# Patient Record
Sex: Male | Born: 1975 | Race: White | Hispanic: No | Marital: Single | Smoking: Never smoker
Health system: Southern US, Community
[De-identification: ages and names within clinical notes are randomized; demographics above are authoritative.]

## PROBLEM LIST (undated history)

## (undated) DIAGNOSIS — Z87442 Personal history of urinary calculi: Secondary | ICD-10-CM

## (undated) DIAGNOSIS — N201 Calculus of ureter: Secondary | ICD-10-CM

---

## 2018-06-21 HISTORY — PX: CYSTOSCOPY/URETEROSCOPY/HOLMIUM LASER/STENT PLACEMENT: SHX6546

## 2020-08-21 ENCOUNTER — Emergency Department (HOSPITAL_COMMUNITY): Payer: BC Managed Care – PPO

## 2020-08-21 ENCOUNTER — Emergency Department (HOSPITAL_COMMUNITY)
Admission: EM | Admit: 2020-08-21 | Discharge: 2020-08-21 | Disposition: A | Discharge status: Home / Self Care | Payer: BC Managed Care – PPO | Attending: Emergency Medicine | Admitting: Emergency Medicine

## 2020-08-21 ENCOUNTER — Encounter (HOSPITAL_COMMUNITY): Payer: Self-pay

## 2020-08-21 DIAGNOSIS — R112 Nausea with vomiting, unspecified: Secondary | ICD-10-CM | POA: Insufficient documentation

## 2020-08-21 DIAGNOSIS — N200 Calculus of kidney: Secondary | ICD-10-CM

## 2020-08-21 DIAGNOSIS — N202 Calculus of kidney with calculus of ureter: Secondary | ICD-10-CM | POA: Diagnosis not present

## 2020-08-21 DIAGNOSIS — R310 Gross hematuria: Secondary | ICD-10-CM | POA: Insufficient documentation

## 2020-08-21 DIAGNOSIS — R109 Unspecified abdominal pain: Secondary | ICD-10-CM | POA: Diagnosis present

## 2020-08-21 LAB — CBC WITH DIFFERENTIAL/PLATELET
Abs Immature Granulocytes: 0.03 10*3/uL (ref 0.00–0.07)
Basophils Absolute: 0 10*3/uL (ref 0.0–0.1)
Basophils Relative: 1 %
Eosinophils Absolute: 0 10*3/uL (ref 0.0–0.5)
Eosinophils Relative: 0 %
HCT: 46.3 % (ref 39.0–52.0)
Hemoglobin: 15.7 g/dL (ref 13.0–17.0)
Immature Granulocytes: 0 %
Lymphocytes Relative: 12 %
Lymphs Abs: 0.9 10*3/uL (ref 0.7–4.0)
MCH: 32.3 pg (ref 26.0–34.0)
MCHC: 33.9 g/dL (ref 30.0–36.0)
MCV: 95.3 fL (ref 80.0–100.0)
Monocytes Absolute: 1.1 10*3/uL — ABNORMAL HIGH (ref 0.1–1.0)
Monocytes Relative: 13 %
Neutro Abs: 6 10*3/uL (ref 1.7–7.7)
Neutrophils Relative %: 74 %
Platelets: 216 10*3/uL (ref 150–400)
RBC: 4.86 MIL/uL (ref 4.22–5.81)
RDW: 12.5 % (ref 11.5–15.5)
WBC: 8.1 10*3/uL (ref 4.0–10.5)
nRBC: 0 % (ref 0.0–0.2)

## 2020-08-21 LAB — BASIC METABOLIC PANEL
Anion gap: 8 (ref 5–15)
BUN: 21 mg/dL — ABNORMAL HIGH (ref 6–20)
CO2: 25 mmol/L (ref 22–32)
Calcium: 9.1 mg/dL (ref 8.9–10.3)
Chloride: 105 mmol/L (ref 98–111)
Creatinine, Ser: 1.15 mg/dL (ref 0.61–1.24)
GFR, Estimated: >60 mL/min (ref >60)
Glucose, Bld: 118 mg/dL — ABNORMAL HIGH (ref 70–99)
Potassium: 4.7 mmol/L (ref 3.5–5.1)
Sodium: 138 mmol/L (ref 135–145)

## 2020-08-21 MED ORDER — KETOROLAC TROMETHAMINE 15 MG/ML IJ SOLN
15.0000 mg | Freq: Once | INTRAMUSCULAR | Status: AC
  Administered 2020-08-21: 15 mg via INTRAVENOUS
  Filled 2020-08-21: qty 1

## 2020-08-21 MED ORDER — ONDANSETRON HCL 4 MG/2ML IJ SOLN
4.0000 mg | Freq: Once | INTRAMUSCULAR | Status: AC
  Administered 2020-08-21: 4 mg via INTRAVENOUS
  Filled 2020-08-21: qty 2

## 2020-08-21 MED ORDER — FENTANYL CITRATE (PF) 100 MCG/2ML IJ SOLN
50.0000 ug | Freq: Once | INTRAMUSCULAR | Status: AC
  Administered 2020-08-21: 50 ug via INTRAVENOUS
  Filled 2020-08-21: qty 2

## 2020-08-21 MED ORDER — HYDROCODONE-ACETAMINOPHEN 5-325 MG PO TABS: 1.0000 | ORAL_TABLET | ORAL | 0 refills | Status: DC | PRN

## 2020-08-21 MED ORDER — SODIUM CHLORIDE 0.9 % IV BOLUS
500.0000 mL | Freq: Once | INTRAVENOUS | Status: AC
  Administered 2020-08-21: 500 mL via INTRAVENOUS

## 2020-08-21 MED ORDER — IBUPROFEN 600 MG PO TABS: 600.0000 mg | ORAL_TABLET | Freq: Four times a day (QID) | ORAL | 0 refills | Status: AC | PRN

## 2020-08-21 MED ORDER — ONDANSETRON 8 MG PO TBDP: 8.0000 mg | ORAL_TABLET | Freq: Three times a day (TID) | ORAL | 0 refills | Status: AC | PRN

## 2020-08-21 NOTE — ED Notes (Signed)
Patient to CT.

## 2020-08-21 NOTE — ED Notes (Signed)
Patient urinated in urinal- refuses foley catheter

## 2020-08-21 NOTE — Discharge Instructions (Addendum)
We saw you in the ER for the abdominal pain. °Our results indicate that you have a kidney stone. °We were able to get your pain is relative control, and we can safely send you home. ° °Take the meds prescribed. °Set up an appointment with the Urologist. °If the pain is unbearable, you start having fevers, chills, and are unable to keep any meds down - then return to the ER. °  °

## 2020-08-21 NOTE — ED Provider Notes (Signed)
[facility] Provider Note   CSN: [id number] Arrival date & time: 08/21/20  1023     History Chief Complaint  Patient presents with   Flank Pain    Carlos Marsh is a 45 y.o. male.  HPI     45 year old male comes in with chief complaint of flank pain. Patient has history of kidney stone that required surgical intervention.  He reports that his current pain started earlier this morning.  Pain is severe and right-sided.  He has associated nausea and vomiting.  Denies any blood in the urine but he is having some urinary urgency.  No pain over his testicles.   History reviewed. No pertinent past medical history.  There are no problems to display for this patient.   History reviewed. No pertinent surgical history.     History reviewed. No pertinent family history.  Social History   Tobacco Use   Smoking status: Never   Smokeless tobacco: Never    Home Medications Prior to Admission medications   Not on File    Allergies    Patient has no known allergies.  Review of Systems   Review of Systems  Constitutional:  Positive for activity change.  Gastrointestinal:  Positive for abdominal pain.  Genitourinary:  Positive for urgency. Negative for testicular pain.  Allergic/Immunologic: Negative for immunocompromised state.  Hematological:  Does not bruise/bleed easily.  All other systems reviewed and are negative.  Physical Exam Updated Vital Signs BP (!) 150/126   Pulse 76   Temp 97.9 F (36.6 C) (Oral)   Resp 18   SpO2 97%   Physical Exam Vitals and nursing note reviewed.  Constitutional:      General: He is in acute distress.     Appearance: He is well-developed.  HENT:     Head: Atraumatic.  Cardiovascular:     Rate and Rhythm: Normal rate.  Pulmonary:     Effort: Pulmonary effort is normal.  Musculoskeletal:     Cervical back: Neck supple.  Skin:    General: Skin is warm.  Neurological:     Mental  Status: He is alert and oriented to person, place, and time.    ED Results / Procedures / Treatments   Labs (all labs ordered are listed, but only abnormal results are displayed) Labs Reviewed  BASIC METABOLIC PANEL - Abnormal; Notable for the following components:      Result Value   Glucose, Bld 118 (*)    BUN 21 (*)    All other components within normal limits  CBC WITH DIFFERENTIAL/PLATELET - Abnormal; Notable for the following components:   Monocytes Absolute 1.1 (*)    All other components within normal limits    EKG None  Radiology US Renal  Result Date: 08/21/2020 CLINICAL DATA:  Evaluate for hydronephrosis Nephrolithiasis EXAM: RENAL / URINARY TRACT ULTRASOUND COMPLETE COMPARISON:  None. FINDINGS: Right Kidney: Renal measurements: 12.1 x 6.2 x 7.1 cm = volume: 282 mL. Mild increased echogenicity the renal cortex without significant thinning. No mass or hydronephrosis visualized. Left Kidney: Renal measurements: 11.4 x 6.3 x 6.2 cm = volume: 231 mL. Echogenicity within normal limits. No mass or hydronephrosis visualized. Bladder: Unable to evaluate due to collapsed configuration. Other: None. IMPRESSION: No acute abnormality of the kidneys.  No hydronephrosis. Electronically Signed   By: Acquanetta Belling M.D.   On: 08/21/2020 12:37   CT Renal Stone Study  Result Date: 08/21/2020 CLINICAL DATA:  Onset right flank pain and difficulty  urinating last night. EXAM: CT ABDOMEN AND PELVIS WITHOUT CONTRAST TECHNIQUE: Multidetector CT imaging of the abdomen and pelvis was performed following the standard protocol without IV contrast. COMPARISON:  Renal ultrasound today. FINDINGS: Lower chest: Mild dependent basilar atelectasis. No pleural or pericardial effusion. Hepatobiliary: No focal liver abnormality is seen. No gallstones, gallbladder wall thickening, or biliary dilatation. Pancreas: Unremarkable. No pancreatic ductal dilatation or surrounding inflammatory changes. Spleen: Normal in size  without focal abnormality. Adrenals/Urinary Tract: The adrenal glands appear normal. There is stranding about the right kidney and ureter. Minimal fullness of the right intrarenal collecting system is identified. The patient has a punctate stone in the right ureter just proximal to the ureterovesical junction. The left kidney and ureter appear normal. The urinary bladder is completely decompressed. No stones are seen in the bladder. Stomach/Bowel: Stomach is within normal limits. Appendix appears normal. No evidence of bowel wall thickening, distention, or inflammatory changes. Vascular/Lymphatic: No significant vascular findings are present. No enlarged abdominal or pelvic lymph nodes. Reproductive: Prostate is unremarkable. Other: None. Musculoskeletal: No acute or focal abnormality. Minimal disc bulge L5-S1 noted. IMPRESSION: Mild fullness of the right intrarenal collecting system with some stranding about the right kidney and ureter due to a punctate stone just proximal to the ureterovesical junction. The study is otherwise negative. Electronically Signed   By: Drusilla Kanner M.D.   On: 08/21/2020 14:27    Procedures Procedures   Medications Ordered in ED Medications  ketorolac (TORADOL) 15 MG/ML injection 15 mg (15 mg Intravenous Given 08/21/20 1148)  fentaNYL (SUBLIMAZE) injection 50 mcg (50 mcg Intravenous Given 08/21/20 1149)  ondansetron (ZOFRAN) injection 4 mg (4 mg Intravenous Given 08/21/20 1158)    ED Course  I have reviewed the triage vital signs and the nursing notes.  Pertinent labs & imaging results that were available during my care of the patient were reviewed by me and considered in my medical decision making (see chart for details).  Clinical Course as of 08/21/20 1728  Fri Aug 21, 2020  1502 Ultrasound was equivocal.  CT completed therefore, it shows a punctate stone.  Patient is comfortable at this time but feels dehydrated.  IV fluid and repeat Toradol ordered.  Stable for  discharge after. [AN]    Clinical Course User Index [AN] Derwood Kaplan, MD   MDM Rules/Calculators/A&P                          45 year old male comes in with chief complaint of flank pain.  He has known history of kidney stone and appears to be in acute distress because of it.  Other possibilities include perforated bowel, torsion of the testicles, diverticulitis, appendicitis, cystitis.  Given the sudden nature of this pain and known history of stone, it is still high on our differential diagnosis.  Ultrasound kidney ordered.  I reviewed the imaging from outside institution which confirmed a 3 mm stone last time he had this problem.  3:07 PM Patient's pain did improve after Toradol.  Ultrasound unfortunately did not reveal any hydronephrosis.  I discussed my clinical suspicion that he has kidney stone despite negative ultrasound.  I approached him to see if he wants to focus on symptom management and returning to the ER if his symptoms get worse with the presumed diagnosis of stone.  Patient wants to get a CT scan to be absolutely sure -therefore CT renal stone has been ordered.  Final Clinical Impression(s) / ED Diagnoses Final diagnoses:  Gross hematuria    Rx / DC Orders ED Discharge Orders     None        Derwood Kaplan, MD 08/21/20 1728

## 2020-08-21 NOTE — ED Triage Notes (Signed)
Pt arrived via walk in, c/o right sided flank pain. Unable to urinate since last night. Hx of kidney stones, states this feels very similar.

## 2020-08-26 ENCOUNTER — Encounter (HOSPITAL_BASED_OUTPATIENT_CLINIC_OR_DEPARTMENT_OTHER): Payer: Self-pay | Admitting: Urology

## 2020-08-26 ENCOUNTER — Other Ambulatory Visit: Payer: Self-pay

## 2020-08-26 ENCOUNTER — Other Ambulatory Visit: Payer: Self-pay | Admitting: Urology

## 2020-08-26 NOTE — Progress Notes (Signed)
Spoke w/ via phone for pre-op interview--- Pt Lab needs dos---- no              Lab results------pt has current lab work ,results in epic, CBCdiff/ BMP. COVID test -----patient states asymptomatic no test needed Arrive at ------- 0530 on 08-28-2020 NPO after MN NO Solid Food.  Clear liquids from MN until--- 0430 Med rec completed Medications to take morning of surgery -----  if needed may take norco/ zofran Diabetic medication ----- n/a Patient instructed no nail polish to be worn day of surgery Patient instructed to bring photo id and insurance card day of surgery Patient aware to have Driver (ride ) / caregiver or 24 hours after surgery --mother, Thurston Hole Patient Special Instructions ----- n/a Pre-Op special Istructions ----- pre-orders pending second sign , case just added on today Patient verbalized understanding of instructions that were given at this phone interview. Patient denies shortness of breath, chest pain, fever, cough at this phone interview.

## 2020-08-28 ENCOUNTER — Ambulatory Visit (HOSPITAL_BASED_OUTPATIENT_CLINIC_OR_DEPARTMENT_OTHER)
Admission: RE | Admit: 2020-08-28 | Discharge: 2020-08-28 | Disposition: A | Discharge status: Home / Self Care | Payer: BC Managed Care – PPO | Attending: Urology | Admitting: Urology

## 2020-08-28 ENCOUNTER — Ambulatory Visit (HOSPITAL_BASED_OUTPATIENT_CLINIC_OR_DEPARTMENT_OTHER): Payer: BC Managed Care – PPO | Admitting: Certified Registered Nurse Anesthetist

## 2020-08-28 ENCOUNTER — Encounter (HOSPITAL_BASED_OUTPATIENT_CLINIC_OR_DEPARTMENT_OTHER)
Admission: RE | Disposition: A | Discharge status: Home / Self Care | Payer: Self-pay | Source: Home / Self Care | Attending: Urology

## 2020-08-28 ENCOUNTER — Encounter (HOSPITAL_BASED_OUTPATIENT_CLINIC_OR_DEPARTMENT_OTHER): Payer: Self-pay | Admitting: Urology

## 2020-08-28 DIAGNOSIS — N132 Hydronephrosis with renal and ureteral calculous obstruction: Secondary | ICD-10-CM | POA: Diagnosis present

## 2020-08-28 HISTORY — DX: Personal history of urinary calculi: Z87.442

## 2020-08-28 HISTORY — PX: CYSTOSCOPY WITH RETROGRADE PYELOGRAM, URETEROSCOPY AND STENT PLACEMENT: SHX5789

## 2020-08-28 HISTORY — DX: Calculus of ureter: N20.1

## 2020-08-28 SURGERY — CYSTOURETEROSCOPY, WITH RETROGRADE PYELOGRAM AND STENT INSERTION
Anesthesia: General | Site: Bladder | Laterality: Right

## 2020-08-28 MED ORDER — FENTANYL CITRATE (PF) 100 MCG/2ML IJ SOLN: 25.0000 ug | INTRAMUSCULAR | Status: DC | PRN

## 2020-08-28 MED ORDER — LACTATED RINGERS IV SOLN: INTRAVENOUS | Status: DC

## 2020-08-28 MED ORDER — DEXAMETHASONE SODIUM PHOSPHATE 10 MG/ML IJ SOLN
INTRAMUSCULAR | Status: DC | PRN
  Administered 2020-08-28: 10 mg via INTRAVENOUS

## 2020-08-28 MED ORDER — ACETAMINOPHEN 500 MG PO TABS
ORAL_TABLET | ORAL | Status: AC
  Filled 2020-08-28: qty 2

## 2020-08-28 MED ORDER — MIDAZOLAM HCL 2 MG/2ML IJ SOLN
INTRAMUSCULAR | Status: AC
  Filled 2020-08-28: qty 2

## 2020-08-28 MED ORDER — PROPOFOL 10 MG/ML IV BOLUS
INTRAVENOUS | Status: AC
  Filled 2020-08-28: qty 20

## 2020-08-28 MED ORDER — DEXAMETHASONE SODIUM PHOSPHATE 10 MG/ML IJ SOLN
INTRAMUSCULAR | Status: AC
  Filled 2020-08-28: qty 1

## 2020-08-28 MED ORDER — ACETAMINOPHEN 500 MG PO TABS
1000.0000 mg | ORAL_TABLET | Freq: Once | ORAL | Status: AC
  Administered 2020-08-28: 1000 mg via ORAL

## 2020-08-28 MED ORDER — FENTANYL CITRATE (PF) 100 MCG/2ML IJ SOLN
INTRAMUSCULAR | Status: AC
  Filled 2020-08-28: qty 2

## 2020-08-28 MED ORDER — ONDANSETRON HCL 4 MG/2ML IJ SOLN
INTRAMUSCULAR | Status: AC
  Filled 2020-08-28: qty 2

## 2020-08-28 MED ORDER — LIDOCAINE HCL (PF) 2 % IJ SOLN
INTRAMUSCULAR | Status: AC
  Filled 2020-08-28: qty 5

## 2020-08-28 MED ORDER — OXYCODONE-ACETAMINOPHEN 5-325 MG PO TABS: 1.0000 | ORAL_TABLET | Freq: Four times a day (QID) | ORAL | 0 refills | Status: AC | PRN

## 2020-08-28 MED ORDER — SODIUM CHLORIDE 0.9 % IR SOLN
Status: DC | PRN
  Administered 2020-08-28: 3000 mL via INTRAVESICAL

## 2020-08-28 MED ORDER — MIDAZOLAM HCL 5 MG/5ML IJ SOLN
INTRAMUSCULAR | Status: DC | PRN
  Administered 2020-08-28: 2 mg via INTRAVENOUS

## 2020-08-28 MED ORDER — IOHEXOL 300 MG/ML  SOLN
INTRAMUSCULAR | Status: DC | PRN
  Administered 2020-08-28: 5 mL via URETHRAL

## 2020-08-28 MED ORDER — LIDOCAINE 2% (20 MG/ML) 5 ML SYRINGE
INTRAMUSCULAR | Status: DC | PRN
  Administered 2020-08-28: 80 mg via INTRAVENOUS

## 2020-08-28 MED ORDER — FENTANYL CITRATE (PF) 100 MCG/2ML IJ SOLN
INTRAMUSCULAR | Status: DC | PRN
  Administered 2020-08-28: 100 ug via INTRAVENOUS

## 2020-08-28 MED ORDER — CEPHALEXIN 500 MG PO CAPS: 500.0000 mg | ORAL_CAPSULE | Freq: Two times a day (BID) | ORAL | 0 refills | Status: AC

## 2020-08-28 MED ORDER — SENNOSIDES-DOCUSATE SODIUM 8.6-50 MG PO TABS: 1.0000 | ORAL_TABLET | Freq: Two times a day (BID) | ORAL | 0 refills | Status: AC

## 2020-08-28 MED ORDER — KETOROLAC TROMETHAMINE 30 MG/ML IJ SOLN
INTRAMUSCULAR | Status: DC | PRN
  Administered 2020-08-28: 30 mg via INTRAVENOUS

## 2020-08-28 MED ORDER — GENTAMICIN SULFATE 40 MG/ML IJ SOLN
5.0000 mg/kg | INTRAVENOUS | Status: AC
  Administered 2020-08-28: 500 mg via INTRAVENOUS
  Filled 2020-08-28: qty 12.5

## 2020-08-28 MED ORDER — PROPOFOL 10 MG/ML IV BOLUS
INTRAVENOUS | Status: DC | PRN
  Administered 2020-08-28: 200 mg via INTRAVENOUS

## 2020-08-28 MED ORDER — KETOROLAC TROMETHAMINE 30 MG/ML IJ SOLN
INTRAMUSCULAR | Status: AC
  Filled 2020-08-28: qty 1

## 2020-08-28 MED ORDER — ONDANSETRON HCL 4 MG/2ML IJ SOLN
INTRAMUSCULAR | Status: DC | PRN
  Administered 2020-08-28: 4 mg via INTRAVENOUS

## 2020-08-28 SURGICAL SUPPLY — 26 items
BAG DRAIN URO-CYSTO SKYTR STRL (DRAIN) ×3 IMPLANT
BAG DRN UROCATH (DRAIN) ×2
BASKET LASER NITINOL 1.9FR (BASKET) IMPLANT
BSKT STON RTRVL 120 1.9FR (BASKET)
CATH INTERMIT  6FR 70CM (CATHETERS) IMPLANT
CLOTH BEACON ORANGE TIMEOUT ST (SAFETY) ×3 IMPLANT
DRSG TEGADERM 2-3/8X2-3/4 SM (GAUZE/BANDAGES/DRESSINGS) ×3 IMPLANT
FIBER LASER FLEXIVA 365 (UROLOGICAL SUPPLIES) IMPLANT
GLOVE SURG ENC MOIS LTX SZ7.5 (GLOVE) ×3 IMPLANT
GOWN STRL REUS W/TWL LRG LVL3 (GOWN DISPOSABLE) ×3 IMPLANT
GUIDEWIRE ANG ZIPWIRE 038X150 (WIRE) ×3 IMPLANT
GUIDEWIRE STR DUAL SENSOR (WIRE) ×3 IMPLANT
IV NS 1000ML (IV SOLUTION) ×3
IV NS 1000ML BAXH (IV SOLUTION) ×2 IMPLANT
IV NS IRRIG 3000ML ARTHROMATIC (IV SOLUTION) ×3 IMPLANT
KIT TURNOVER CYSTO (KITS) ×3 IMPLANT
MANIFOLD NEPTUNE II (INSTRUMENTS) ×3 IMPLANT
NS IRRIG 500ML POUR BTL (IV SOLUTION) ×3 IMPLANT
PACK CYSTO (CUSTOM PROCEDURE TRAY) ×3 IMPLANT
STENT POLARIS 5FRX26 (STENTS) ×3 IMPLANT
SYR 10ML LL (SYRINGE) ×3 IMPLANT
TRACTIP FLEXIVA PULS ID 200XHI (Laser) IMPLANT
TRACTIP FLEXIVA PULSE ID 200 (Laser)
TUBE CONNECTING 12X1/4 (SUCTIONS) ×3 IMPLANT
TUBE FEEDING 8FR 16IN STR KANG (MISCELLANEOUS) IMPLANT
TUBING UROLOGY SET (TUBING) ×3 IMPLANT

## 2020-08-28 NOTE — H&P (Signed)
Carlos Marsh is an 45 y.o. male.    Chief Complaint: Pre-OP RIGHT Ureteroscopic Stone Manipulation  HPI:   1 - Recurrent Urolithiasis -  2020 - U[place] another city  08/2020 - 104mm Rt distal stone (just above intramural ureter) by ER CT 08/21/20 on eval flank pain and irritative voiding. Cr 1.1. Given trial of medical therapy with alpha blocker and pain meds.    PMH unremakable. NO CV disase / blood thinners. He works as Merchandiser, retail at [employer]. No PCP at present.   Today "[place]" is seen to proceed with RIGHT ureteroscopy for small distal stone with refractory colic. No interval fevers.   Past Medical History:  Diagnosis Date   History of kidney stones    Right ureteral stone     Past Surgical History:  Procedure Laterality Date   CYSTOSCOPY/URETEROSCOPY/HOLMIUM LASER/STENT PLACEMENT Left 06/21/2018   @ [facility]    History reviewed. No pertinent family history. Social History:  reports that he has never smoked. He has never used smokeless tobacco. He reports current alcohol use. He reports that he does not use drugs.  Allergies: No Known Allergies  Medications Prior to Admission  Medication Sig Dispense Refill   HYDROcodone-acetaminophen (NORCO/VICODIN) 5-325 MG tablet Take 1 tablet by mouth every 4 (four) hours as needed. 6 tablet 0   ibuprofen (ADVIL) 600 MG tablet Take 1 tablet (600 mg total) by mouth every 6 (six) hours as needed. 30 tablet 0   ondansetron (ZOFRAN ODT) 8 MG disintegrating tablet Take 1 tablet (8 mg total) by mouth every 8 (eight) hours as needed for nausea. 20 tablet 0    No results found for this or any previous visit (from the past 48 hour(s)). No results found.  Review of Systems  Constitutional: Negative.  Negative for chills and fever.  Genitourinary:  Positive for flank pain and urgency.   Blood pressure (!) 142/92, pulse 60, temperature 98.1 F (36.7 C), temperature source Oral, resp. rate 16, height 6' (1.829 m),  weight 99.9 kg, SpO2 97 %. Physical Exam Vitals reviewed.  HENT:     Head: Normocephalic.     Nose: Nose normal.     Mouth/Throat:     Mouth: Mucous membranes are moist.  Eyes:     Pupils: Pupils are equal, round, and reactive to light.  Cardiovascular:     Rate and Rhythm: Normal rate.     Pulses: Normal pulses.  Pulmonary:     Effort: Pulmonary effort is normal.  Abdominal:     General: Abdomen is flat.  Genitourinary:    Comments: Mild Rt  CVAT at present Musculoskeletal:        General: Normal range of motion.     Cervical back: Normal range of motion.  Skin:    General: Skin is warm.  Neurological:     General: No focal deficit present.     Mental Status: He is alert.  Psychiatric:        Mood and Affect: Mood normal.     Assessment/Plan  Proceed as planned with RIGHT ureteroscopic stone manipulation with goal of stone free. Risks, benefits, alternatives, expected peri-op course discussed previously and reiterated today.   Sebastian Ache, MD 08/28/2020, 7:05 AM

## 2020-08-28 NOTE — Anesthesia Procedure Notes (Signed)
Procedure Name: LMA Insertion Date/Time: 08/28/2020 7:25 AM Performed by: Sharyon Peitz D, CRNA Pre-anesthesia Checklist: Patient identified, Emergency Drugs available, Suction available and Patient being monitored Patient Re-evaluated:Patient Re-evaluated prior to induction Oxygen Delivery Method: Circle system utilized Preoxygenation: Pre-oxygenation with 100% oxygen Induction Type: IV induction Ventilation: Mask ventilation without difficulty LMA: LMA inserted LMA Size: 5.0 Tube type: Oral Number of attempts: 1 Placement Confirmation: positive ETCO2 and breath sounds checked- equal and bilateral Tube secured with: Tape Dental Injury: Teeth and Oropharynx as per pre-operative assessment

## 2020-08-28 NOTE — Brief Op Note (Signed)
08/28/2020  8:00 AM  PATIENT:  Carlos Marsh  45 y.o. male  PRE-OPERATIVE DIAGNOSIS:  RIGHT URETERAL STONE  POST-OPERATIVE DIAGNOSIS:  RIGHT URETERAL STONE  PROCEDURE:  Procedure(s) with comments: CYSTOSCOPY WITH RETROGRADE PYELOGRAM, URETEROSCOPY AND STENT PLACEMENT (Right) - 1 HR  SURGEON:  Surgeon(s) and Role:    * Sebastian Ache, MD - Primary  PHYSICIAN ASSISTANT:   ASSISTANTS: none   ANESTHESIA:   general  EBL:  minimal   BLOOD ADMINISTERED:none  DRAINS: none   LOCAL MEDICATIONS USED:  NONE  SPECIMEN:  Source of Specimen:  Rt ureteral stone  DISPOSITION OF SPECIMEN:   [facility] for compositional analysis  COUNTS:  YES  TOURNIQUET:  * No tourniquets in log *  DICTATION: .Other Dictation: Dictation Number [id number]  PLAN OF CARE: Discharge to home after PACU  PATIENT DISPOSITION:  PACU - hemodynamically stable.   Delay start of Pharmacological VTE agent (>24hrs) due to surgical blood loss or risk of bleeding: not applicable

## 2020-08-28 NOTE — Transfer of Care (Signed)
Immediate Anesthesia Transfer of Care Note  Patient: Carlos Marsh  Procedure(s) Performed: CYSTOSCOPY WITH RETROGRADE PYELOGRAM, URETEROSCOPY , STONE BASKETTING, AND STENT PLACEMENT (Right: Bladder)  Patient Location: PACU  Anesthesia Type:General  Level of Consciousness: awake, alert  and oriented  Airway & Oxygen Therapy: Patient Spontanous Breathing and Patient connected to nasal cannula oxygen  Post-op Assessment: Report given to RN and Post -op Vital signs reviewed and stable  Post vital signs: Reviewed and stable  Last Vitals:  Vitals Value Taken Time  BP 135/98 08/28/20 0815  Temp    Pulse 59 08/28/20 0817  Resp 16 08/28/20 0817  SpO2 95 % 08/28/20 0817  Vitals shown include unvalidated device data.  Last Pain:  Vitals:   08/28/20 0558  TempSrc: Oral  PainSc: 0-No pain      Patients Stated Pain Goal: 3 (08/28/20 0558)  Complications: No notable events documented.

## 2020-08-28 NOTE — Anesthesia Postprocedure Evaluation (Signed)
Anesthesia Post Note  Patient: Carlos Marsh  Procedure(s) Performed: CYSTOSCOPY WITH RETROGRADE PYELOGRAM, URETEROSCOPY , STONE BASKETTING, AND STENT PLACEMENT (Right: Bladder)     Patient location during evaluation: PACU Anesthesia Type: General Level of consciousness: awake and alert Pain management: pain level controlled Vital Signs Assessment: post-procedure vital signs reviewed and stable Respiratory status: spontaneous breathing, nonlabored ventilation, respiratory function stable and patient connected to nasal cannula oxygen Cardiovascular status: blood pressure returned to baseline and stable Postop Assessment: no apparent nausea or vomiting Anesthetic complications: no   No notable events documented.  Last Vitals:  Vitals:   08/28/20 0830 08/28/20 0845  BP: 126/85 114/88  Pulse: 64 (!) 56  Resp: 17 13  Temp:    SpO2: 100% 95%    Last Pain:  Vitals:   08/28/20 0819  TempSrc: Oral  PainSc:                  Dezyrae Kensinger L Huyen Perazzo

## 2020-08-28 NOTE — Anesthesia Preprocedure Evaluation (Addendum)
Anesthesia Evaluation  Patient identified by MRN, date of birth, ID band Patient awake    Reviewed: Allergy & Precautions, NPO status , Patient's Chart, lab work & pertinent test results  Airway Mallampati: III  TM Distance: >3 FB Neck ROM: Full    Dental no notable dental hx. (+) Teeth Intact, Dental Advisory Given   Pulmonary neg pulmonary ROS,    Pulmonary exam normal breath sounds clear to auscultation       Cardiovascular negative cardio ROS Normal cardiovascular exam Rhythm:Regular Rate:Normal     Neuro/Psych negative neurological ROS  negative psych ROS   GI/Hepatic negative GI ROS, Neg liver ROS,   Endo/Other  negative endocrine ROS  Renal/GU negative Renal ROS  negative genitourinary   Musculoskeletal negative musculoskeletal ROS (+)   Abdominal   Peds  Hematology negative hematology ROS (+)   Anesthesia Other Findings Right ureteral stone  Reproductive/Obstetrics                            Anesthesia Physical Anesthesia Plan  ASA: 1  Anesthesia Plan: General   Post-op Pain Management:    Induction: Intravenous  PONV Risk Score and Plan: 2 and Ondansetron, Dexamethasone and Midazolam  Airway Management Planned: LMA  Additional Equipment:   Intra-op Plan:   Post-operative Plan: Extubation in OR  Informed Consent: I have reviewed the patients History and Physical, chart, labs and discussed the procedure including the risks, benefits and alternatives for the proposed anesthesia with the patient or authorized representative who has indicated his/her understanding and acceptance.     Dental advisory given  Plan Discussed with: CRNA  Anesthesia Plan Comments:         Anesthesia Quick Evaluation

## 2020-08-28 NOTE — Discharge Instructions (Addendum)
1 - You may have urinary urgency (bladder spasms) and bloody urine on / off with stent in place. This is normal.  2 - Remove tethered stent on Monday mornging at home by pulling on string, then blue-white plastic tubing, and discarding. Office is open Monday if any problems arise.   3 - Call MD or go to ER for fever >102, severe pain / nausea / vomiting not relieved by medications, or acute change in medical status  Post Anesthesia Home Care Instructions  Activity: Get plenty of rest for the remainder of the day. A responsible adult should stay with you for 24 hours following the procedure.  For the next 24 hours, DO NOT: -Drive a car -Advertising copywriter -Drink alcoholic beverages -Take any medication unless instructed by your physician -Make any legal decisions or sign important papers.  Meals: Start with liquid foods such as gelatin or soup. Progress to regular foods as tolerated. Avoid greasy, spicy, heavy foods. If nausea and/or vomiting occur, drink only clear liquids until the nausea and/or vomiting subsides. Call your physician if vomiting continues.  Special Instructions/Symptoms: Your throat may feel dry or sore from the anesthesia or the breathing tube placed in your throat during surgery. If this causes discomfort, gargle with warm salt water. The discomfort should disappear within 24 hours.        [facility] [phone] Post Ureteroscopy With or Without Stent Instructions  Definitions:  Ureter: The duct that transports urine from the kidney to the bladder. Stent:   A plastic hollow tube that is placed into the ureter, from the kidney to the bladder to prevent the ureter from swelling shut.  GENERAL INSTRUCTIONS:  Despite the fact that no skin incisions were used, the area around the ureter and bladder is raw and irritated. The stent is a foreign body which will further irritate the bladder wall. This irritation is manifested by increased frequency  of urination, both day and night, and by an increase in the urge to urinate. In some, the urge to urinate is present almost always. Sometimes the urge is strong enough that you may not be able to stop yourself from urinating. The only real cure is to remove the stent and then give time for the bladder wall to heal which can't be done until the danger of the ureter swelling shut has passed, which varies.  You may see some blood in your urine while the stent is in place and a few days afterwards. Do not be alarmed, even if the urine was clear for a while. Get off your feet and drink lots of fluids until clearing occurs. If you start to pass clots or don't improve, call us.  DIET: You may return to your normal diet immediately. Because of the raw surface of your bladder, alcohol, spicy foods, acid type foods and drinks with caffeine may cause irritation or frequency and should be used in moderation. To keep your urine flowing freely and to avoid constipation, drink plenty of fluids during the day ( 8-10 glasses ). Tip: Avoid cranberry juice because it is very acidic.  ACTIVITY: Your physical activity doesn't need to be restricted. However, if you are very active, you may see some blood in your urine. We suggest that you reduce your activity under these circumstances until the bleeding has stopped.  BOWELS: It is important to keep your bowels regular during the postoperative period. Straining with bowel movements can cause bleeding. A bowel movement every other day is reasonable. Use  a mild laxative if needed, such as Milk of Magnesia 2-3 tablespoons, or 2 Dulcolax tablets. Call if you continue to have problems. If you have been taking narcotics for pain, before, during or after your surgery, you may be constipated. Take a laxative if necessary.   MEDICATION: You should resume your pre-surgery medications unless told not to. In addition you will often be given an antibiotic to prevent infection. These  should be taken as prescribed until the bottles are finished unless you are having an unusual reaction to one of the drugs.  PROBLEMS YOU SHOULD REPORT TO [place]: Fevers over 100.5 Fahrenheit. Heavy bleeding, or clots ( See above notes about blood in urine ). Inability to urinate. Drug reactions ( hives, rash, nausea, vomiting, diarrhea ). Severe burning or pain with urination that is not improving.  FOLLOW-UP: You will need a follow-up appointment to monitor your progress. Call for this appointment at the number listed above. Usually the first appointment will be about three to fourteen days after your surgery.

## 2020-08-31 ENCOUNTER — Encounter (HOSPITAL_BASED_OUTPATIENT_CLINIC_OR_DEPARTMENT_OTHER): Payer: Self-pay | Admitting: Urology

## 2020-09-01 NOTE — Op Note (Signed)
NAMEDEAKIN, LACEK MEDICAL RECORD NO: [id number] ACCOUNT NO: [id number] DATE OF BIRTH: [birth year] FACILITY: [facility] LOCATION: [facility] PHYSICIAN: Sebastian Ache, MD  Operative Report   DATE OF PROCEDURE: 08/28/2020  PREOPERATIVE DIAGNOSIS:  Right distal ureteral stone with refractory colic.  PROCEDURES PERFORMED: 1.  Cystoscopy, right retrograde pyelogram interpretation. 2.  Right ureteroscopy, basketing of stone. 3.  Insertion of right ureteral stent, 5 x 26 Polaris with tether.  ESTIMATED BLOOD LOSS:  Nil.  COMPLICATIONS:  None.  SPECIMEN:  Right ureteral stone for composition analysis.  FINDINGS:  1.  Minimal right hydronephrosis. 2.  Small, mostly proteinaceous appearing stone within the proximal right ureter, retrograde position from distal. 3.  Moderate mucosal edema in the distal ureter and within renal pelvis. 4.  Successful placement of right ureteral stent, proximal end in renal pelvis, distal end in urinary bladder.  INDICATIONS:  The patient is a pleasant 45 year old man with history of recurrent urolithiasis.  He was found on workup of colicky flank pain have a right distal ureteral stone.  There was minimal hydronephrosis.  This was relatively small, only at  around 3 mm or so.  He appropriately went on a trial of medical therapy with alpha blockers and pain medicines; however, he failed to pass his stone and his colic was quite difficult to control.  Options were discussed for further management including  continued medical therapy versus shockwave lithotripsy versus ureteroscopy.  He wished to proceed with the latter with a goal of stone free.  Informed consent was obtained and placed in the medical record.  DESCRIPTION OF PROCEDURE:  The patient being verified, procedure being right ureteroscopic stone manipulation was confirmed.  Procedure timeout was performed.  Intravenous antibiotics administered.  General LMA anesthesia induced.  The patient was placed   into a low lithotomy position.  Sterile field was created, prepped and draped the patient's penis, perineum and proximal thighs using iodine.  Cystourethroscopy was performed using 21-French rigid cystoscope with offset lens.  Inspection of anterior and  posterior urethra was unremarkable.  Inspection of bladder revealed no diverticula, calcifications or papillary lesions.  The right ureteral orifice was cannulated with a 6-French renal catheter and right retrograde pyelogram was obtained.  Right retrograde pyelogram demonstrated a single right ureter, single system right kidney.  There was no hydronephrosis, whatsoever.  There was a questionable mobile filling defect within the distal ureter.  A 0.038 ZIPwire was advanced to the level of  upper pole, set aside as a safety wire.  An 8-French feeding tube placed in the urinary bladder for pressure release.  A semirigid ureteroscopy was performed of the distal right ureter alongside a separate sensor working wire.  In the area of the distal  ureter, just above the intramural ureter, where we expected to find the stone, no calcification was noted.  There was some mild mucosal edema in this area.  This was concerning for possible retrograde positioning of the stone and the remaining distal  four-fifths of right ureter was inspected with the semirigid scope.  No calcifications were noted.  It was highly suspicious of retrograde positioning. The semirigid scope was exchanged over a sensor working wire for a flexible digital ureteroscope using  fluoroscopic guidance to the level of the kidney and very careful endoscopic exam was performed using a flexible digital scope including all calices x3.  There was a small, mostly proteinaceous appearing calcification in the upper pole calix.  This is  felt to most likely represent retrograde positioning of  prior stone.  This was amenable to simple basketing.  It was grasped with the escape basket and carefully navigated out  in its entirety.  There was no additional calcifications or mucosal  abnormalities noted of the right ureter.  Given the patient's relatively young age and submucosal edema of the distal ureter, it was felt that brief interval stenting with tether stent would be most advantageous and a new 5 x 26 Polaris type stent was  placed over the remaining safety wire using fluoroscopic guidance.  Good proximal and distal plane were noted.  Tether was left in place and fashioned to the dorsum of penis.  The procedure was terminated.  The patient tolerated the procedure well.  No  immediate perioperative complications.  The patient was taken to postanesthesia care in stable condition.  Plan for discharge home.   SHW D: 08/28/2020 8:05:37 am T: 08/28/2020 8:37:00 am  JOB: [id number]/ [id number]

## 2022-08-03 IMAGING — CT CT RENAL STONE PROTOCOL
2 of 4 series · 16 of 46 positions shown, 18 images · non-contrast
Comparison: Renal ultrasound today.

CLINICAL DATA: Onset right flank pain and difficulty urinating last
night.

EXAM:
CT ABDOMEN AND PELVIS WITHOUT CONTRAST
TECHNIQUE: Multidetector CT imaging of the abdomen and pelvis was performed
following the standard protocol without IV contrast.

[Series 2: axial st · axial · 0.97mm/px · z∈[+858,+1353]mm · 13 of 111 slices shown, 15 images]
[im 6/111  soft-tissue]
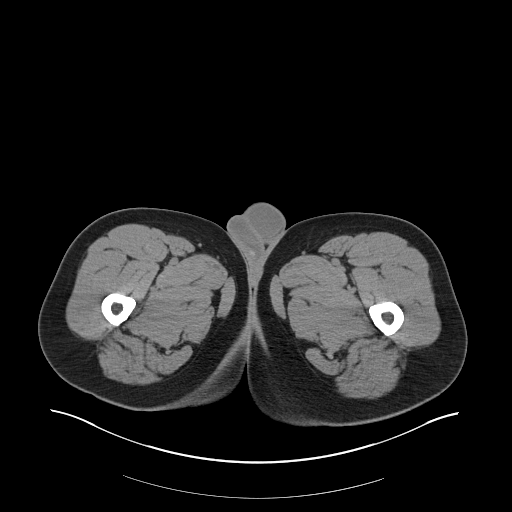
[im 6/111  bone]
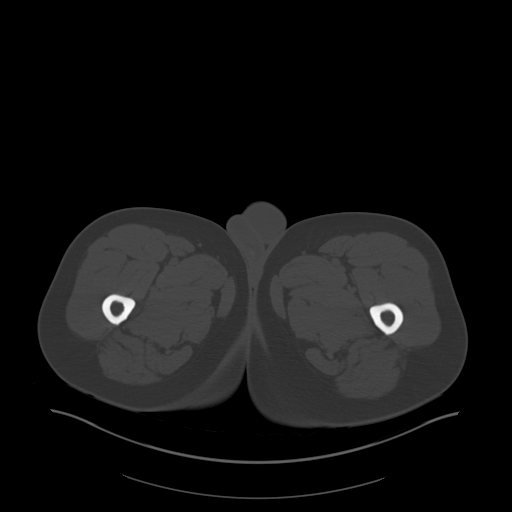
[im 18/111  soft-tissue]
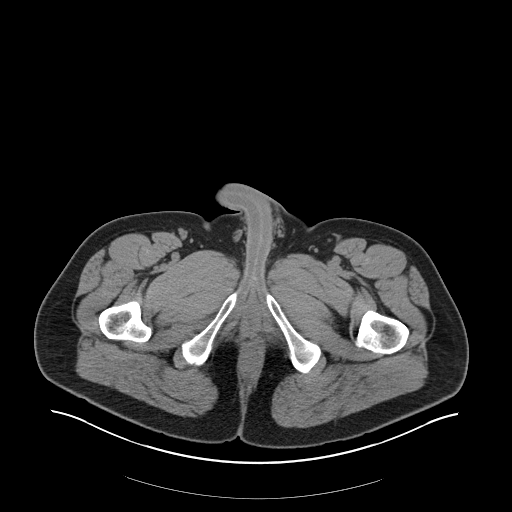
[im 24/111  soft-tissue]
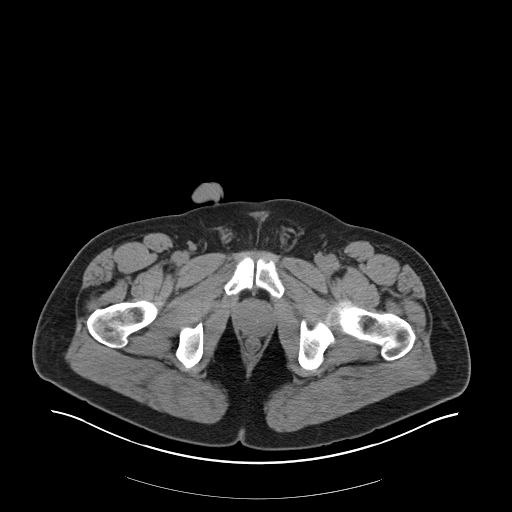
[im 29/111  soft-tissue]
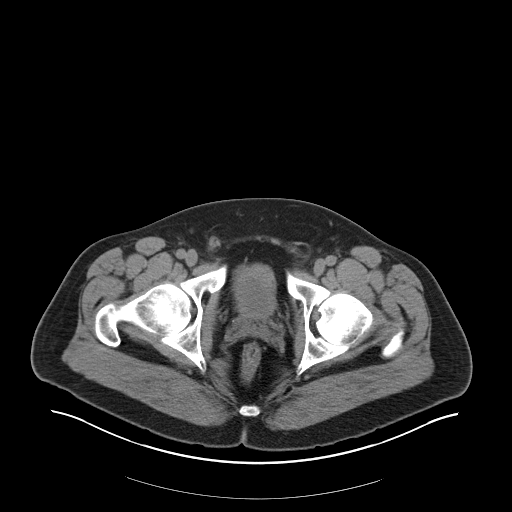
[im 41/111  soft-tissue]
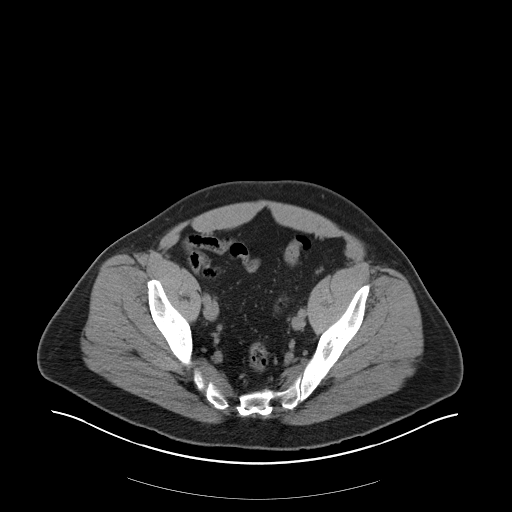
[im 47/111  soft-tissue]
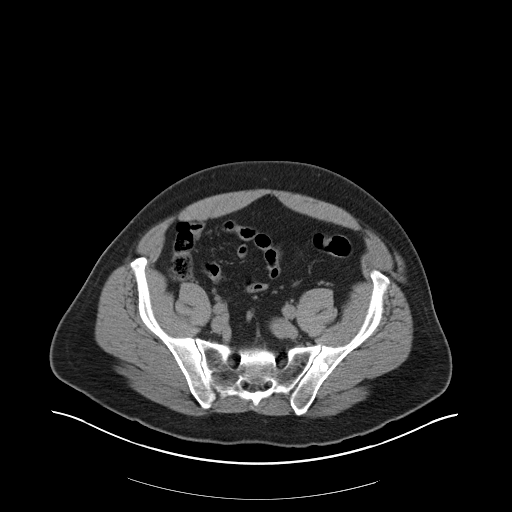
[im 58/111  soft-tissue]
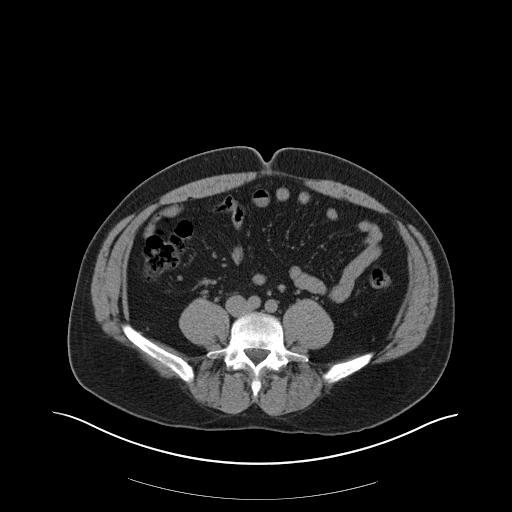
[im 64/111  soft-tissue]
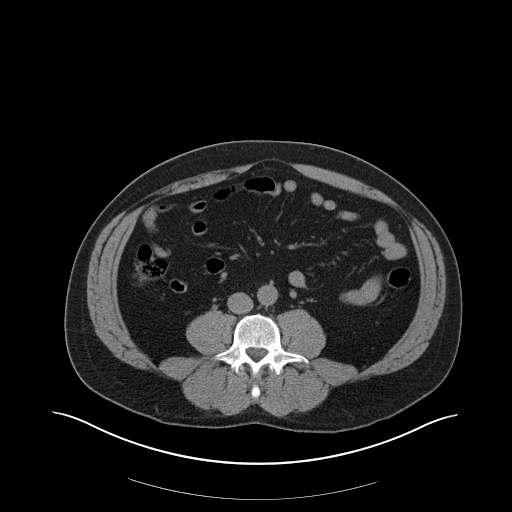
[im 70/111  soft-tissue]
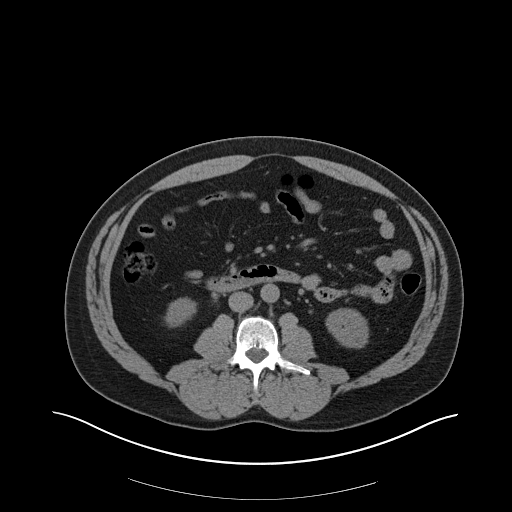
[im 70/111  bone]
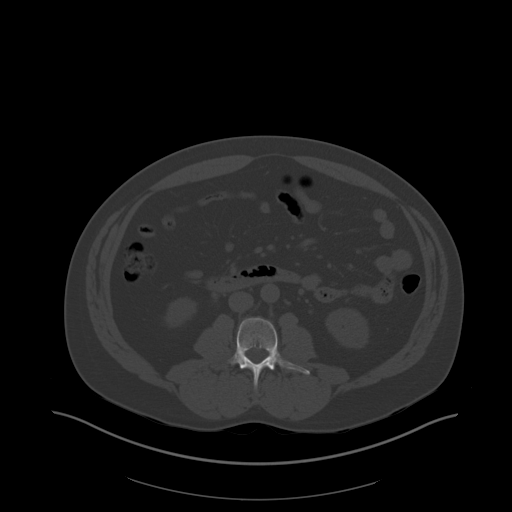
[im 82/111  soft-tissue]
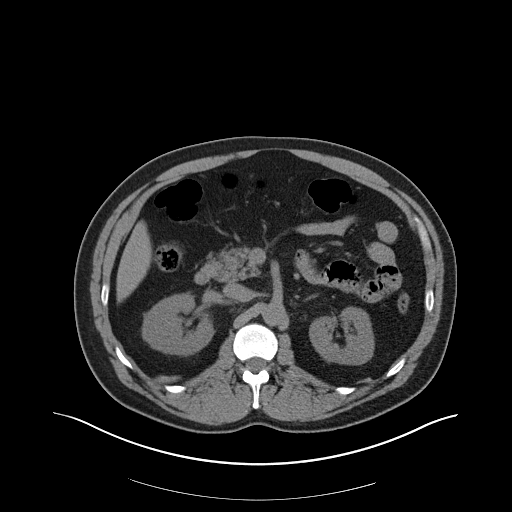
[im 87/111  soft-tissue]
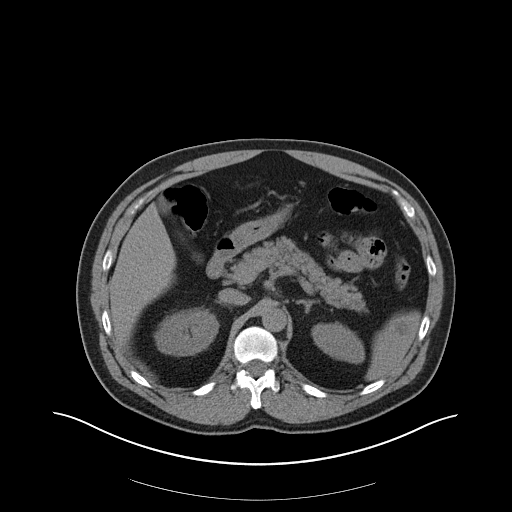
[im 93/111  soft-tissue]
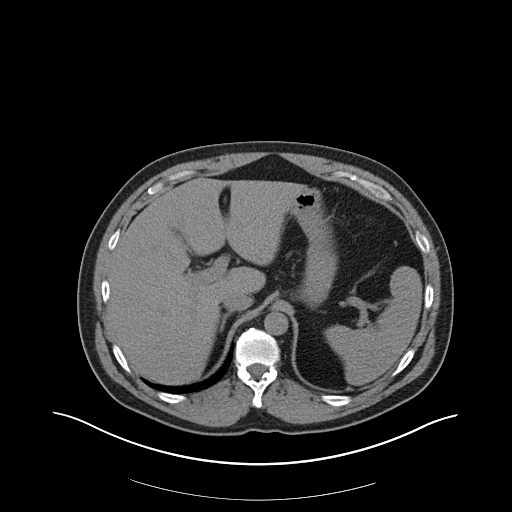
[im 105/111  soft-tissue]
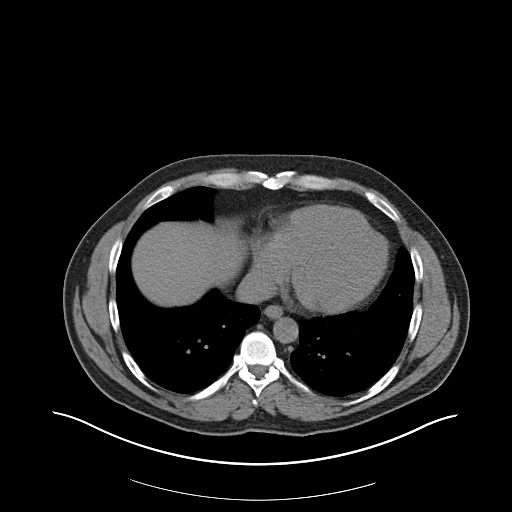

[Series 5: coronal · coronal · 1.00mm/px · 3 of 165 slices shown]
[im 55/165  soft-tissue]
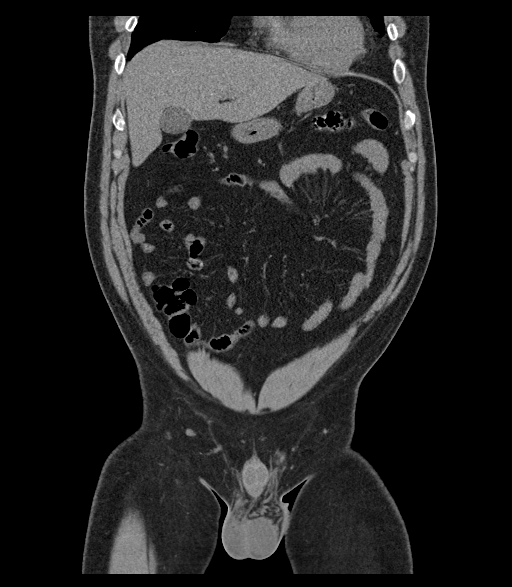
[im 73/165  soft-tissue]
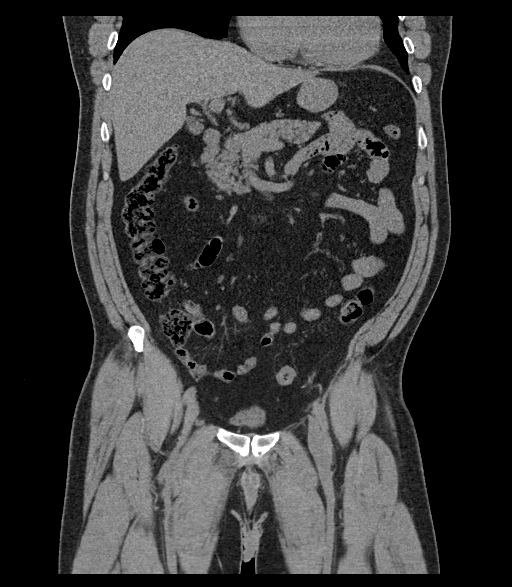
[im 92/165  soft-tissue]
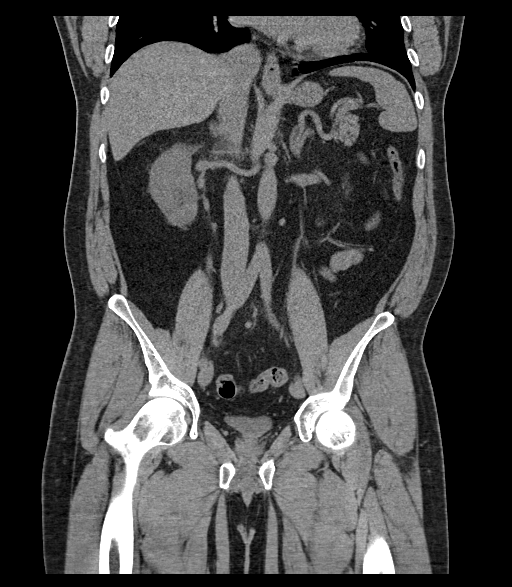

[16 of 46 positions shown; findings below may reference images not displayed]

FINDINGS: Lower chest: Mild dependent basilar atelectasis. No pleural or
pericardial effusion.

Hepatobiliary: No focal liver abnormality is seen. No gallstones,
gallbladder wall thickening, or biliary dilatation.

Pancreas: Unremarkable. No pancreatic ductal dilatation or
surrounding inflammatory changes.

Spleen: Normal in size without focal abnormality.

Adrenals/Urinary Tract: The adrenal glands appear normal.

There is stranding about the right kidney and ureter. Minimal
fullness of the right intrarenal collecting system is identified.
The patient has a punctate stone in the right ureter just proximal
to the ureterovesical junction. The left kidney and ureter appear
normal. The urinary bladder is completely decompressed. No stones
are seen in the bladder.

Stomach/Bowel: Stomach is within normal limits. Appendix appears
normal. No evidence of bowel wall thickening, distention, or
inflammatory changes.

Vascular/Lymphatic: No significant vascular findings are present. No
enlarged abdominal or pelvic lymph nodes.

Reproductive: Prostate is unremarkable.

Other: None.

Musculoskeletal: No acute or focal abnormality. Minimal disc bulge
L5-S1 noted.
IMPRESSION: Mild fullness of the right intrarenal collecting system with some
stranding about the right kidney and ureter due to a punctate stone
just proximal to the ureterovesical junction. The study is otherwise
negative.
